# Patient Record
Sex: Female | Born: 1990 | Race: White | Hispanic: No | Marital: Single
Health system: Midwestern US, Academic
[De-identification: ages and names within clinical notes are randomized; demographics above are authoritative.]

---

## 2019-02-12 ENCOUNTER — Ambulatory Visit: Admit: 2019-02-12 | Discharge: 2019-02-12 | Payer: BC Managed Care – PPO

## 2019-02-12 ENCOUNTER — Encounter: Admit: 2019-02-12 | Discharge: 2019-02-12 | Payer: BC Managed Care – PPO

## 2019-02-12 ENCOUNTER — Ambulatory Visit: Admit: 2019-02-12 | Discharge: 2019-02-13 | Payer: BC Managed Care – PPO

## 2019-03-16 ENCOUNTER — Encounter: Admit: 2019-03-16 | Discharge: 2019-03-16

## 2019-03-16 DIAGNOSIS — N96 Recurrent pregnancy loss: Secondary | ICD-10-CM

## 2019-03-17 ENCOUNTER — Ambulatory Visit: Admit: 2019-03-16 | Discharge: 2019-03-16

## 2019-03-17 ENCOUNTER — Ambulatory Visit: Admit: 2019-03-16 | Discharge: 2019-03-17

## 2019-03-17 DIAGNOSIS — N96 Recurrent pregnancy loss: Principal | ICD-10-CM

## 2019-03-19 ENCOUNTER — Encounter: Admit: 2019-03-19 | Discharge: 2019-03-19

## 2019-03-19 NOTE — Telephone Encounter
Patient was disclosed that patient was exposed to a COVID + person. Patient given choice to be swabbed. Patient refused swab but would like a follow up phone call in a couple of days.

## 2019-03-22 ENCOUNTER — Encounter: Admit: 2019-03-22 | Discharge: 2019-03-22

## 2019-04-15 ENCOUNTER — Encounter: Admit: 2019-04-15 | Discharge: 2019-04-15

## 2019-04-24 ENCOUNTER — Encounter: Admit: 2019-04-24 | Discharge: 2019-04-24

## 2022-01-17 IMAGING — US OB<[ID]
2 series · 14 of 16 positions shown · non-contrast
Comparison: none

[Series 1: us ob <= 14 weeks fetus · 13 of 101 slices shown (1 of 2)]
[im 1/101]
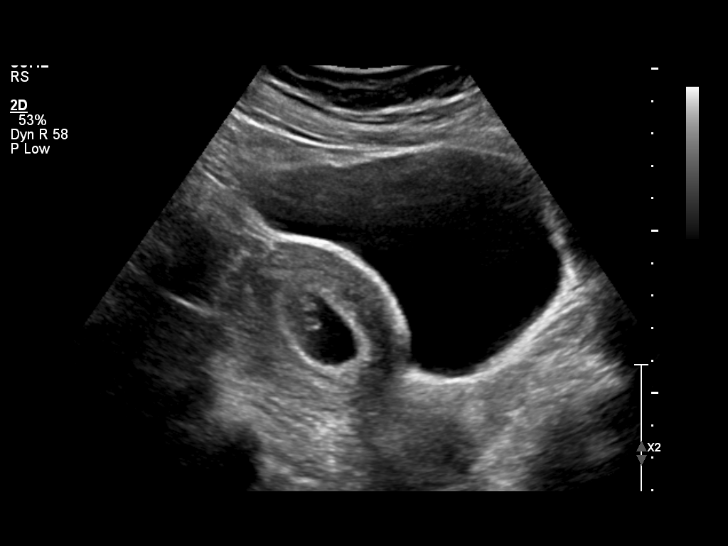
[im 8/101]
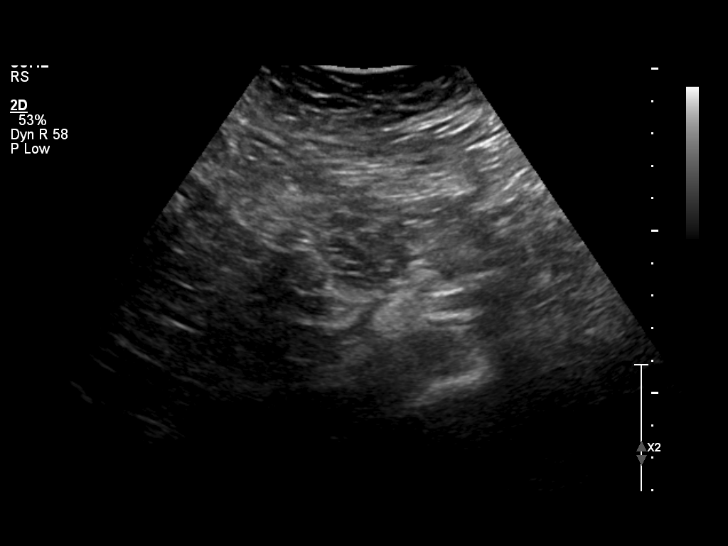
[im 15/101]
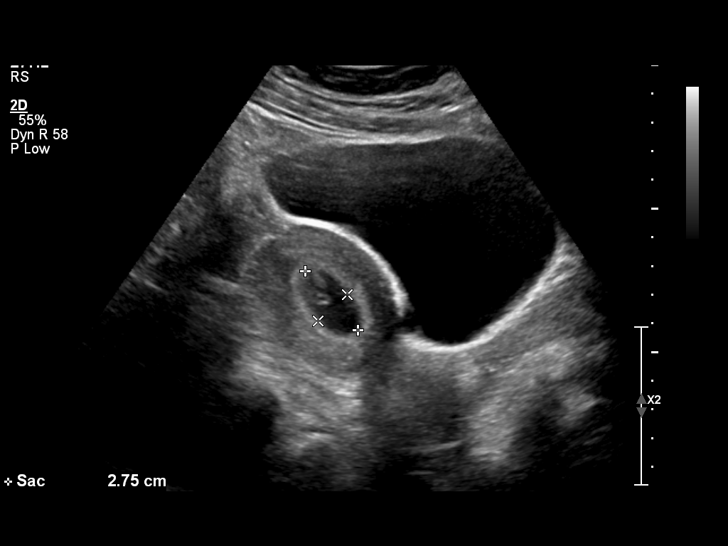
[im 29/101]
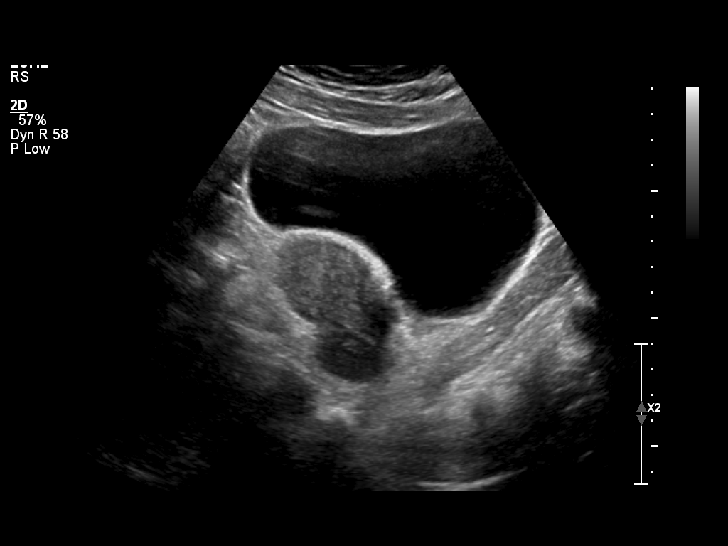
[im 36/101]
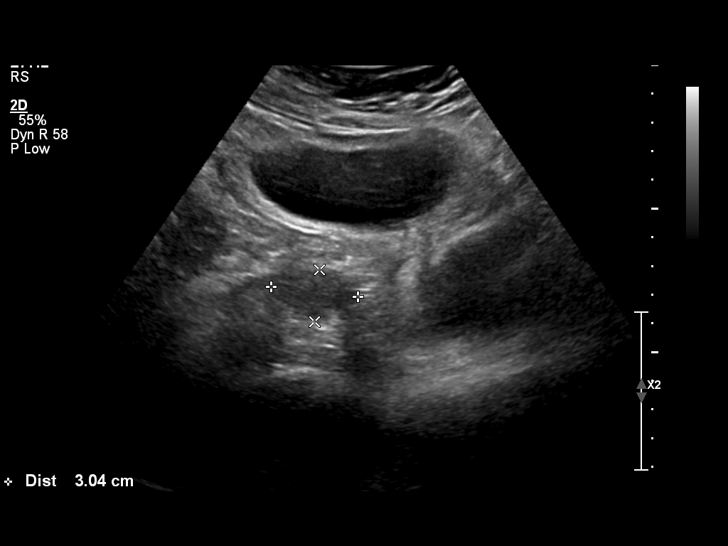
[im 43/101]
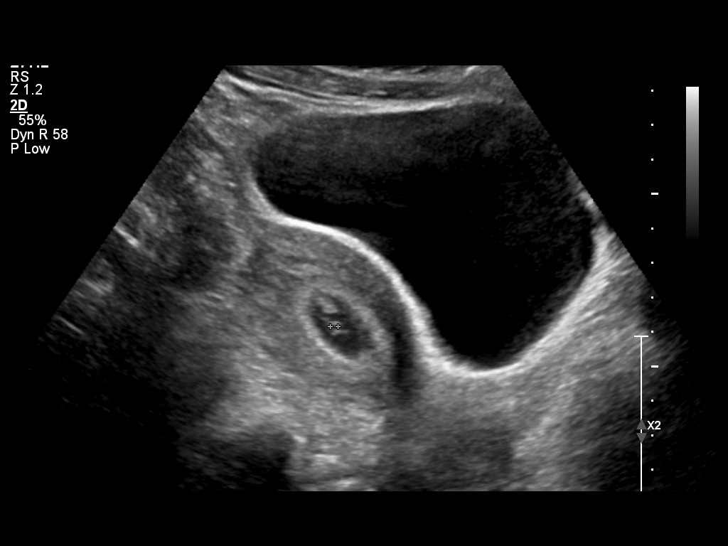
[im 51/101]
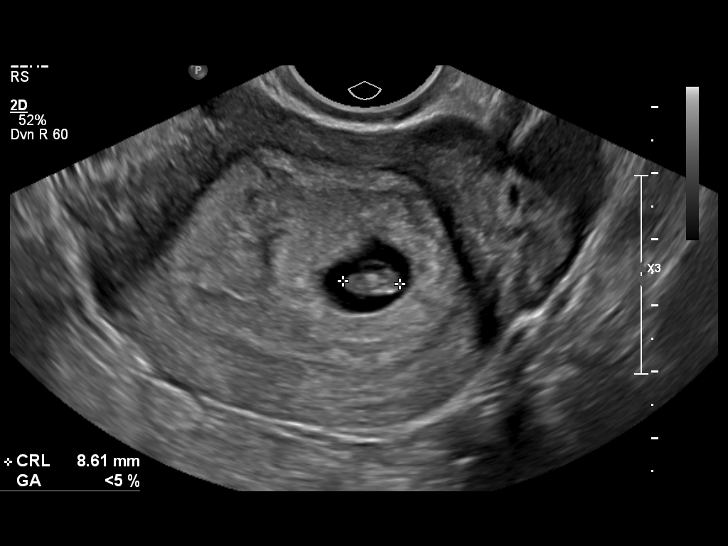
[im 58/101]
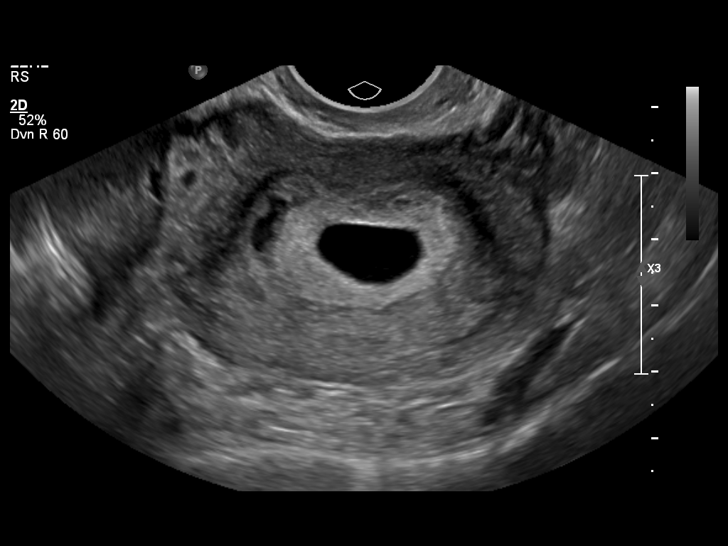
[im 65/101]
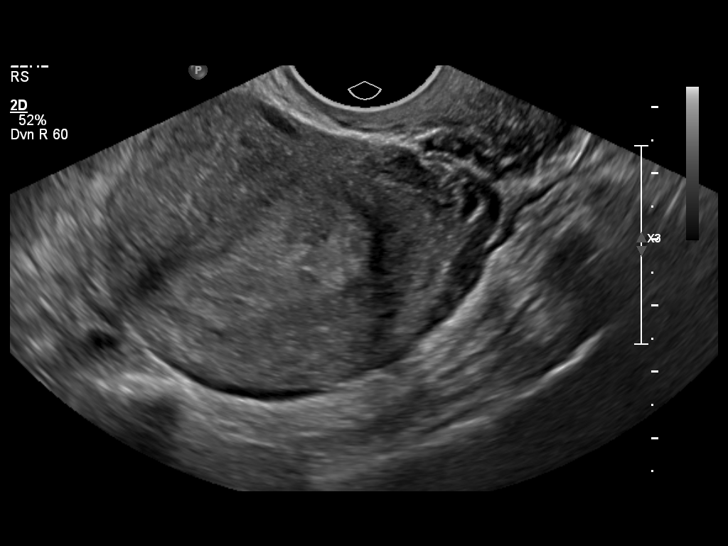
[im 72/101]
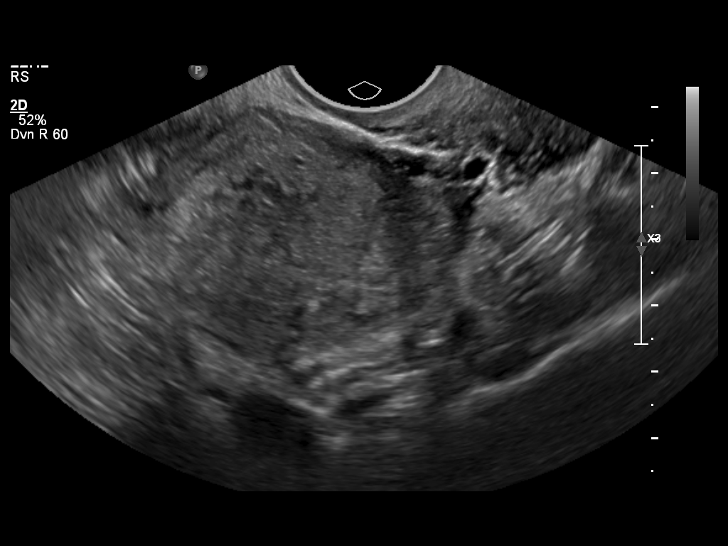
[im 86/101]
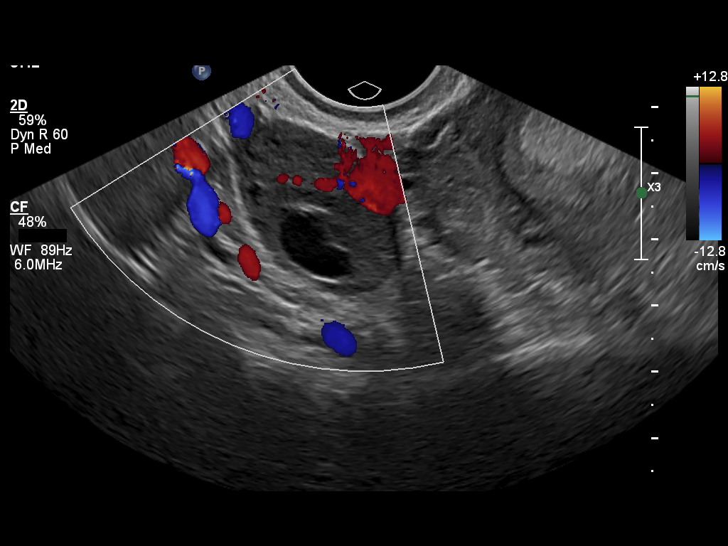
[im 93/101]
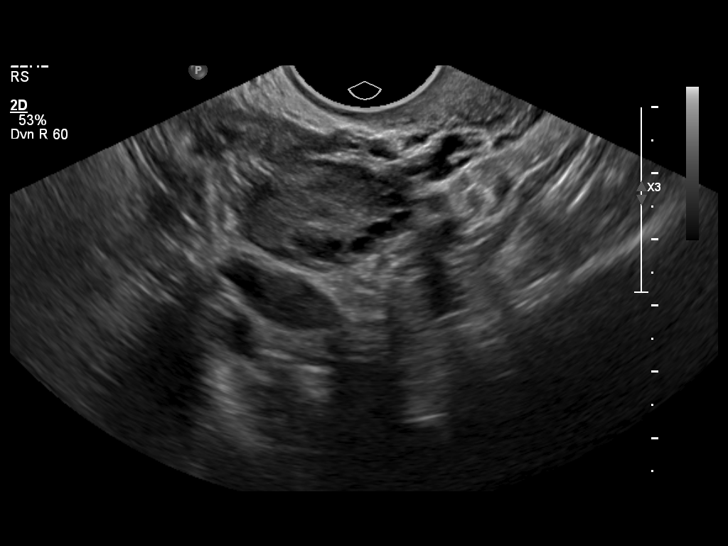
[im 101/101]
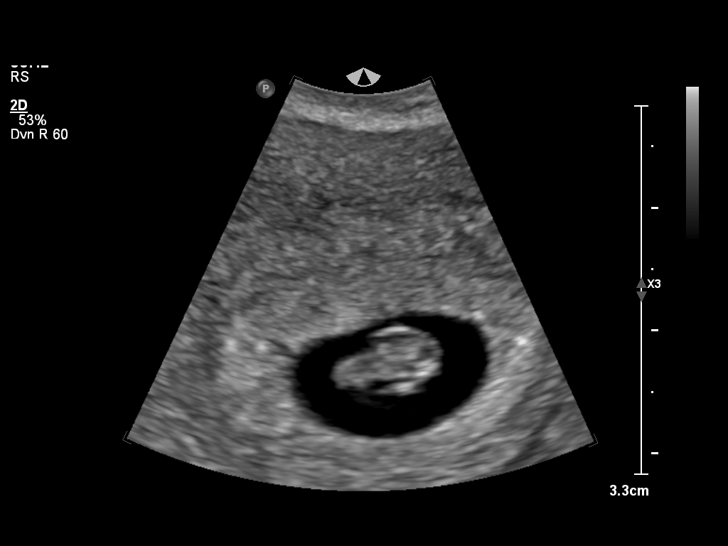

[Series 1: us ob <= 14 weeks fetus · 1 of 1 slices shown (2 of 2)]
[im 1/1]
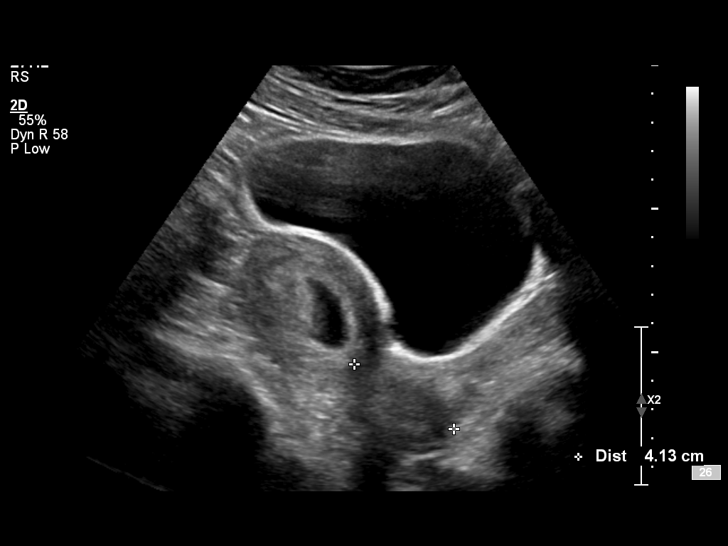

[14 of 16 positions shown; findings below may reference images not displayed]

EXAM

ULTRASOUND OB COMPLETE LESS THAN 14 WEEKS

INDICATION

CONFIRM EDC AND VIABILITY
CONFIRM EDC AND VIABILITY; G5P0

COMPARISONS

None

FINDINGS

GENERAL EVALUATION:

Cardiac activity: Present.

FETAL BIOMETRY:

CRL: 8.31 mm          FHR: 127 bpm

MATERNAL STRUCTURES:

Uterus: 9.6 cm x 4.5 cm x 6.3 cm

No adnexal mass.  The ovaries were identified.

Gestational sac shape: Normal in appearance.

Placenta: Too early for evaluation.

Amniotic fluid: Subjectively within limits.

No subchorionic hemorrhage/hematoma.

IMPRESSION

Single live intrauterine pregnancy with estimated gestational age based on crown-rump length
measurement of  6 weeks 6 days correlating with an EDC of 09/23/2021.

Tech Notes:

CONFIRM EDC AND VIABILITY; G5P0
